# Patient Record
Sex: Female | Born: 2005 | Race: Black or African American | Hispanic: Yes | Marital: Single
Health system: Southern US, Community
[De-identification: ages and names within clinical notes are randomized; demographics above are authoritative.]

## PROBLEM LIST (undated history)

## (undated) DIAGNOSIS — J302 Other seasonal allergic rhinitis: Secondary | ICD-10-CM

## (undated) DIAGNOSIS — R062 Wheezing: Secondary | ICD-10-CM

## (undated) HISTORY — DX: Other seasonal allergic rhinitis: J30.2

## (undated) HISTORY — PX: TONSILLECTOMY: SUR1361

---
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
## 2023-09-02 ENCOUNTER — Other Ambulatory Visit: Payer: Self-pay

## 2023-09-02 ENCOUNTER — Encounter (HOSPITAL_COMMUNITY): Payer: Self-pay | Admitting: *Deleted

## 2023-09-02 ENCOUNTER — Emergency Department (HOSPITAL_COMMUNITY)
Admission: EM | Admit: 2023-09-02 | Discharge: 2023-09-03 | Discharge status: Against Medical Advice | Payer: Medicaid Other | Attending: Emergency Medicine | Admitting: Emergency Medicine

## 2023-09-02 DIAGNOSIS — M79602 Pain in left arm: Secondary | ICD-10-CM | POA: Insufficient documentation

## 2023-09-02 DIAGNOSIS — Y92410 Unspecified street and highway as the place of occurrence of the external cause: Secondary | ICD-10-CM | POA: Diagnosis not present

## 2023-09-02 DIAGNOSIS — Y9301 Activity, walking, marching and hiking: Secondary | ICD-10-CM | POA: Insufficient documentation
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
## 2023-09-03 NOTE — ED Notes (Signed)
Pt called multiple times no answer
